# Patient Record
Sex: Female | Born: 2000 | Hispanic: No | Marital: Single | Smoking: Never smoker
Health system: Southern US, Community
[De-identification: ages and names within clinical notes are randomized; demographics above are authoritative.]

## PROBLEM LIST (undated history)

## (undated) DIAGNOSIS — R55 Syncope and collapse: Secondary | ICD-10-CM

## (undated) DIAGNOSIS — L0591 Pilonidal cyst without abscess: Secondary | ICD-10-CM

## (undated) HISTORY — DX: Pilonidal cyst without abscess: L05.91

---
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
## 2022-01-24 ENCOUNTER — Encounter: Payer: Self-pay | Admitting: Physician Assistant

## 2022-01-24 ENCOUNTER — Ambulatory Visit (INDEPENDENT_AMBULATORY_CARE_PROVIDER_SITE_OTHER): Payer: Medicaid Other | Admitting: Physician Assistant

## 2022-01-24 VITALS — BP 108/74 | HR 82 | Temp 97.1°F | Ht 62.6 in | Wt 145.6 lb

## 2022-01-24 DIAGNOSIS — Z0000 Encounter for general adult medical examination without abnormal findings: Secondary | ICD-10-CM | POA: Diagnosis not present

## 2022-01-24 NOTE — Patient Instructions (Signed)
Welcome to [facility] at [facility]! It was a pleasure meeting you today.   PLEASE NOTE:  If you had any LAB tests please let us know if you have not heard back within a few days. You may see your results on MyChart before we have a chance to review them but we will give you a call once they are reviewed by us. If we ordered any REFERRALS today, please let us know if you have not heard from their office within the next two weeks. Let us know through MyChart if you are needing REFILLS, or have your pharmacy send us the request. You can also use MyChart to communicate with me or any office staff.  Please try these tips to maintain a healthy lifestyle:  Eat most of your calories during the day when you are active. Eliminate processed foods including packaged sweets (pies, cakes, cookies), reduce intake of potatoes, white bread, white pasta, and white rice. Look for whole grain options, oat flour or almond flour.  Each meal should contain half fruits/vegetables, one quarter protein, and one quarter carbs (no bigger than a computer mouse).  Cut down on sweet beverages. This includes juice, soda, and sweet tea. Also watch fruit intake, though this is a healthier sweet option, it still contains natural sugar! Limit to 3 servings daily.  Drink at least 1 glass of water with each meal and aim for at least 8 glasses (64 ounces) per day.  Exercise at least 150 minutes every week to the best of your ability.    Take Care,  Daleena Rotter, PA-C    

## 2022-01-24 NOTE — Progress Notes (Signed)
Subjective:    Patient ID: Cindy Cook, female    DOB: 2001/03/11, 21 y.o.   MRN: [id number]  Chief Complaint  Patient presents with   New Patient (Initial Visit)    New pt I office to establish care with PCP; pt due for annual CPE; pt has no health concerns at this time;     HPI Patient is in today for new patient visit. 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 Overall normal health history, healthy family.   Acute concerns: No health concerns today   Health maintenance: Lifestyle/ exercise: Enjoys exercising, tries to stay active with work and walking  Nutrition: Well-balanced, drinks plenty of water  Mental health: Doing well  Caffeine: Not daily  Sleep: No issues  Substance use: None ETOH: None  Sexual activity: Active, monogamous with one female, uses condoms  Immunizations: Declines flu vaccine  Menstrual cycles: Regular   Past Medical History:  Diagnosis Date   Pilonidal cyst    Syncope     History reviewed. No pertinent surgical history.  Family History  Problem Relation Age of Onset   Healthy Mother    Healthy Father    Healthy Sister    Healthy Brother     Social History   Tobacco Use   Smoking status: Never    Passive exposure: Yes   Smokeless tobacco: Never  Vaping Use   Vaping Use: Never used  Substance Use Topics   Alcohol use: Never   Drug use: Never     No Known Allergies  Review of Systems NEGATIVE UNLESS OTHERWISE INDICATED IN HPI      Objective:     BP 108/74 (BP Location: Left Arm)   Pulse 82   Temp (!) 97.1 F (36.2 C) (Temporal)   Ht 5' 2.6" (1.59 m)   Wt 145 lb 9.6 oz (66 kg)   LMP 01/19/2022 (Exact Date)   SpO2 98%   BMI 26.12 kg/m   Wt Readings from Last 3 Encounters:  01/24/22 145 lb 9.6 oz (66 kg) (77 %, Z= 0.75)*  08/24/20 140 lb 12.8 oz (63.9 kg) (77 %, Z= 0.73)*  08/21/19 128 lb (58.1 kg) (63 %, Z= 0.33)*   * Growth percentiles are based on CDC (Girls, 2-20 Years) data.    BP Readings from  Last 3 Encounters:  01/24/22 108/74  08/24/20 (!) 108/60 (44 %, Z = -0.15 /  30 %, Z = -0.52)*  08/21/19 (!) 90/62 (2 %, Z = -2.05 /  39 %, Z = -0.28)*   *BP percentiles are based on the 2017 AAP Clinical Practice Guideline for girls     Physical Exam Vitals and nursing note reviewed.  Constitutional:      Appearance: Normal appearance. She is normal weight. She is not toxic-appearing.  HENT:     Head: Normocephalic and atraumatic.     Right Ear: Tympanic membrane, ear canal and external ear normal.     Left Ear: Tympanic membrane, ear canal and external ear normal.     Nose: Nose normal.     Mouth/Throat:     Mouth: Mucous membranes are moist.  Eyes:     Extraocular Movements: Extraocular movements intact.     Conjunctiva/sclera: Conjunctivae normal.     Pupils: Pupils are equal, round, and reactive to light.  Cardiovascular:     Rate and Rhythm: Normal rate and regular rhythm.     Pulses: Normal pulses.     Heart sounds: Normal heart sounds.  Pulmonary:  Effort: Pulmonary effort is normal.     Breath sounds: Normal breath sounds.  Abdominal:     General: Abdomen is flat. Bowel sounds are normal.     Palpations: Abdomen is soft.  Musculoskeletal:        General: Normal range of motion.     Cervical back: Normal range of motion and neck supple.  Skin:    General: Skin is warm and dry.  Neurological:     General: No focal deficit present.     Mental Status: She is alert and oriented to person, place, and time.  Psychiatric:        Mood and Affect: Mood normal.        Behavior: Behavior normal.        Thought Content: Thought content normal.        Judgment: Judgment normal.        Assessment & Plan:  Encounter for annual physical exam   21 yo female establishing care New pt physical completed No health concerns, defer blood work at this time Discussed safe sex, she is using condoms, does not want birth control; confident in her relationship and declines G/C  screening at this time. Declines flu shot. She will keep up good work with lifestyle - exercise, diet, self-care. Call if concerns.      Return in about 1 year (around 01/25/2023) for Complete physical .    Gerod Caligiuri M Diyari Cherne, PA-C

## 2022-07-04 ENCOUNTER — Encounter: Payer: Self-pay | Admitting: Physician Assistant

## 2022-07-19 ENCOUNTER — Ambulatory Visit: Payer: Medicaid Other | Admitting: Physician Assistant

## 2022-07-19 VITALS — BP 104/66 | HR 73 | Temp 97.7°F | Ht 63.0 in | Wt 154.0 lb

## 2022-07-19 DIAGNOSIS — Z00129 Encounter for routine child health examination without abnormal findings: Secondary | ICD-10-CM

## 2022-07-19 DIAGNOSIS — Z111 Encounter for screening for respiratory tuberculosis: Secondary | ICD-10-CM

## 2022-07-19 NOTE — Patient Instructions (Signed)
Best wishes

## 2022-07-19 NOTE — Progress Notes (Signed)
Subjective:    Patient ID: Cindy Cook, female    DOB: Jul 22, 2000, 22 y.o.   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 No health changes since November.   Very healthy history, no concerns.  Takes no medications or supplements. Enjoys walking outside. Does the best she can with nutrition.   No psychiatric history.  No substance abuse.   Feels well.   Past Medical History:  Diagnosis Date   Pilonidal cyst    Syncope     No past surgical history on file.  Family History  Problem Relation Age of Onset   Healthy Mother    Healthy Father    Healthy Sister    Healthy Brother     Social History   Tobacco Use   Smoking status: Never    Passive exposure: Yes   Smokeless tobacco: Never  Vaping Use   Vaping Use: Never used  Substance Use Topics   Alcohol use: Never   Drug use: Never     No Known Allergies  Review of Systems NEGATIVE UNLESS OTHERWISE INDICATED IN HPI      Objective:     BP 104/66 (BP Location: Left Arm)   Pulse 73   Temp 97.7 F (36.5 C) (Temporal)   Ht 5\' 3"  (1.6 m)   Wt 154 lb (69.9 kg)   LMP 07/16/2022 (Exact Date)   SpO2 100%   BMI 27.28 kg/m   Wt Readings from Last 3 Encounters:  07/19/22 154 lb (69.9 kg) (83 %, Z= 0.97)*  01/24/22 145 lb 9.6 oz (66 kg) (77 %, Z= 0.75)*  08/24/20 140 lb 12.8 oz (63.9 kg) (77 %, Z= 0.73)*   * Growth percentiles are based on CDC (Girls, 2-20 Years) data.    BP Readings from Last 3 Encounters:  07/19/22 104/66  01/24/22 108/74  08/24/20 (!) 108/60 (44 %, Z = -0.15 /  30 %, Z = -0.52)*   *BP percentiles are based on the 2017 AAP Clinical Practice Guideline for girls     Physical Exam Vitals and nursing note reviewed.   Constitutional:      Appearance: Normal appearance.  Eyes:     Extraocular Movements: Extraocular movements intact.     Conjunctiva/sclera: Conjunctivae normal.     Pupils: Pupils are equal, round, and reactive to light.  Cardiovascular:     Rate and Rhythm: Normal rate and regular rhythm.     Pulses: Normal pulses.  Pulmonary:     Effort: Pulmonary effort is normal.     Breath sounds: Normal breath sounds.  Skin:    Findings: No rash.  Neurological:     General: No focal deficit present.     Mental Status: She is alert and oriented to person, place, and time.  Psychiatric:        Mood and Affect: Mood normal.        Behavior: Behavior normal.        Thought Content: Thought content normal.        Judgment: Judgment normal.        Assessment & Plan:  Encounter for pre-school health examination  Screening for tuberculosis -     QuantiFERON-TB Gold Plus   Healthy  female, no health concerns. 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 Forms completed for her today. She will have TB gold completed.  Recheck as scheduled.    Jaret Coppedge M Korbin Mapps, PA-C

## 2022-07-21 LAB — QUANTIFERON-TB GOLD PLUS
Mitogen-NIL: 7.71 IU/mL
NIL: 0.02 IU/mL
QuantiFERON-TB Gold Plus: NEGATIVE
TB1-NIL: 0 IU/mL
TB2-NIL: 0 IU/mL

## 2023-01-29 ENCOUNTER — Encounter: Payer: Medicaid Other | Admitting: Physician Assistant

## 2023-04-04 ENCOUNTER — Encounter: Payer: Self-pay | Admitting: Physician Assistant

## 2023-04-04 ENCOUNTER — Ambulatory Visit (INDEPENDENT_AMBULATORY_CARE_PROVIDER_SITE_OTHER): Payer: Medicaid Other | Admitting: Physician Assistant

## 2023-04-04 VITALS — BP 118/70 | HR 100 | Temp 98.1°F | Ht 63.0 in | Wt 151.0 lb

## 2023-04-04 DIAGNOSIS — Z0000 Encounter for general adult medical examination without abnormal findings: Secondary | ICD-10-CM

## 2023-04-04 DIAGNOSIS — R8290 Unspecified abnormal findings in urine: Secondary | ICD-10-CM

## 2023-04-04 LAB — CBC WITH DIFFERENTIAL/PLATELET
Basophils Absolute: 0 10*3/uL (ref 0.0–0.1)
Basophils Relative: 0.3 % (ref 0.0–3.0)
Eosinophils Absolute: 0.1 10*3/uL (ref 0.0–0.7)
Eosinophils Relative: 1.2 % (ref 0.0–5.0)
HCT: 42.6 % (ref 36.0–46.0)
Hemoglobin: 13.5 g/dL (ref 12.0–15.0)
Lymphocytes Relative: 26.4 % (ref 12.0–46.0)
Lymphs Abs: 1.9 10*3/uL (ref 0.7–4.0)
MCHC: 31.7 g/dL (ref 30.0–36.0)
MCV: 77.1 fL — ABNORMAL LOW (ref 78.0–100.0)
Monocytes Absolute: 0.5 10*3/uL (ref 0.1–1.0)
Monocytes Relative: 6.9 % (ref 3.0–12.0)
Neutro Abs: 4.7 10*3/uL (ref 1.4–7.7)
Neutrophils Relative %: 65.2 % (ref 43.0–77.0)
Platelets: 248 10*3/uL (ref 150.0–400.0)
RBC: 5.53 Mil/uL — ABNORMAL HIGH (ref 3.87–5.11)
RDW: 14.7 % — ABNORMAL HIGH (ref 11.5–14.6)
WBC: 7.2 10*3/uL (ref 4.5–10.5)

## 2023-04-04 LAB — POC URINALSYSI DIPSTICK (AUTOMATED)
Bilirubin, UA: NEGATIVE
Blood, UA: NEGATIVE
Glucose, UA: NEGATIVE
Ketones, UA: NEGATIVE
Leukocytes, UA: NEGATIVE
Nitrite, UA: POSITIVE
Protein, UA: NEGATIVE
Spec Grav, UA: 1.015 (ref 1.010–1.025)
Urobilinogen, UA: 0.2 U/dL
pH, UA: 6 (ref 5.0–8.0)

## 2023-04-04 LAB — LIPID PANEL
Cholesterol: 89 mg/dL (ref 0–200)
HDL: 37.2 mg/dL — ABNORMAL LOW (ref >39.00)
LDL Cholesterol: 37 mg/dL (ref 0–99)
NonHDL: 51.8
Total CHOL/HDL Ratio: 2
Triglycerides: 73 mg/dL (ref 0.0–149.0)
VLDL: 14.6 mg/dL (ref 0.0–40.0)

## 2023-04-04 LAB — COMPREHENSIVE METABOLIC PANEL
ALT: 25 U/L (ref 0–35)
AST: 24 U/L (ref 0–37)
Albumin: 4.9 g/dL (ref 3.5–5.2)
Alkaline Phosphatase: 41 U/L (ref 39–117)
BUN: 9 mg/dL (ref 6–23)
CO2: 23 meq/L (ref 19–32)
Calcium: 9.3 mg/dL (ref 8.4–10.5)
Chloride: 104 meq/L (ref 96–112)
Creatinine, Ser: 0.68 mg/dL (ref 0.40–1.20)
GFR: 125.38 mL/min (ref >60.00)
Glucose, Bld: 80 mg/dL (ref 70–99)
Potassium: 3.6 meq/L (ref 3.5–5.1)
Sodium: 137 meq/L (ref 135–145)
Total Bilirubin: 1 mg/dL (ref 0.2–1.2)
Total Protein: 7.6 g/dL (ref 6.0–8.3)

## 2023-04-04 LAB — TSH: TSH: 0.92 u[IU]/mL (ref 0.35–5.50)

## 2023-04-04 NOTE — Progress Notes (Signed)
Patient ID: Cindy Cook, female    DOB: [birth year], 22 y.o.   MRN: [id number]   Assessment & Plan:  Annual physical exam -     CBC with Differential/Platelet -     Comprehensive metabolic panel -     TSH -     Lipid panel -     POCT Urinalysis Dipstick (Automated)  Abnormal urine odor -     POCT Urinalysis Dipstick (Automated)   Age-appropriate screening and counseling performed today. Will check labs and call with results. Preventive measures discussed and printed in AVS for patient.   Patient Counseling: [x]   Nutrition: Stressed importance of moderation in sodium/caffeine intake, saturated fat and cholesterol, caloric balance, sufficient intake of fresh fruits, vegetables, and fiber.  [x]   Stressed the importance of regular exercise.   [x]   Substance Abuse: Discussed cessation/primary prevention of tobacco, alcohol, or other drug use; driving or other dangerous activities under the influence; availability of treatment for abuse.   []   Injury prevention: Discussed safety belts, safety helmets, smoke detector, smoking near bedding or upholstery.   [x]   Sexuality: Discussed sexually transmitted diseases, partner selection, use of condoms, avoidance of unintended pregnancy  and contraceptive alternatives.   [x]   Dental health: Discussed importance of regular tooth brushing, flossing, and dental visits.  [x]   Health maintenance and immunizations reviewed. Please refer to Health maintenance section.        Strong Urine Odor Noted for the past 6 months. No dysuria, urgency, frequency, or hematuria. No significant changes in diet or fluid intake. No recent sexual activity. Possible differential diagnoses include dehydration, dietary changes, urinary tract infection, or bacterial vaginosis. -Collect urine sample for urinalysis. -Consider vaginal swab if no clear cause identified from urinalysis.  General Health Maintenance -Plan for Pap smear at age 73.           Return in about 1 year (around 04/03/2024).    Subjective:    Chief Complaint  Patient presents with   Annual Exam    Pt in office for annual CPE and fasting labs; pt wanting to know if she can update TDAP today but not due till August; pt will need for school;     HPI Discussed the use of AI scribe software for clinical note transcription with the patient, who gave verbal consent to proceed.  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 She reports a strong urine odor for the past six months, but denies dysuria, urinary frequency, or urgency. She has not noticed any changes in her diet or fluid intake, and denies significant caffeine use. She also reports regular menstrual cycles and denies recent sexual activity. She has noticed vaginal discharge, but describes it as her 'normal.' She has been exercising regularly, including walking, running, and weight lifting. She denies any skin changes or rashes. She recently returned from a trip to [place] to visit family.       Past Medical History:  Diagnosis Date   Pilonidal cyst    Syncope     History reviewed. No pertinent surgical history.  Family History  Problem Relation Age of Onset   Healthy Mother    Varicose Veins Mother    Healthy Father    Healthy Sister    Healthy Brother    Early death Maternal Uncle     Social History   Tobacco Use   Smoking status: Never    Passive exposure:  Yes   Smokeless tobacco: Never  Vaping Use   Vaping status: Never Used  Substance Use Topics   Alcohol use: Never   Drug use: Never     No Known Allergies  Review of Systems NEGATIVE UNLESS OTHERWISE INDICATED IN HPI      Objective:     BP 118/70 (BP Location: Left Arm, Patient Position: Sitting, Cuff Size: Normal)   Pulse 100   Temp 98.1 F (36.7 C) (Temporal)   Ht 5\' 3"  (1.6 m)   Wt 151 lb (68.5 kg)   LMP 03/09/2023 (Exact Date)   SpO2 95%   BMI 26.75  kg/m   Wt Readings from Last 3 Encounters:  04/04/23 151 lb (68.5 kg)  07/19/22 154 lb (69.9 kg) (83%, Z= 0.97)*  01/24/22 145 lb 9.6 oz (66 kg) (77%, Z= 0.75)*   * Growth percentiles are based on CDC (Girls, 2-20 Years) data.    BP Readings from Last 3 Encounters:  04/04/23 118/70  07/19/22 104/66  01/24/22 108/74     Physical Exam Vitals and nursing note reviewed.  Constitutional:      Appearance: Normal appearance. She is normal weight. She is not toxic-appearing.  HENT:     Head: Normocephalic and atraumatic.     Right Ear: Tympanic membrane, ear canal and external ear normal.     Left Ear: Tympanic membrane, ear canal and external ear normal.     Nose: Nose normal.     Mouth/Throat:     Mouth: Mucous membranes are moist.  Eyes:     Extraocular Movements: Extraocular movements intact.     Conjunctiva/sclera: Conjunctivae normal.     Pupils: Pupils are equal, round, and reactive to light.  Cardiovascular:     Rate and Rhythm: Normal rate and regular rhythm.     Pulses: Normal pulses.     Heart sounds: Normal heart sounds.  Pulmonary:     Effort: Pulmonary effort is normal.     Breath sounds: Normal breath sounds.  Abdominal:     General: Abdomen is flat. Bowel sounds are normal.     Palpations: Abdomen is soft.  Musculoskeletal:        General: Normal range of motion.     Cervical back: Normal range of motion and neck supple.  Skin:    General: Skin is warm and dry.  Neurological:     General: No focal deficit present.     Mental Status: She is alert and oriented to person, place, and time.  Psychiatric:        Mood and Affect: Mood normal.        Behavior: Behavior normal.        Thought Content: Thought content normal.        Judgment: Judgment normal.         Hyde Sires M Othel Hoogendoorn, PA-C

## 2023-04-06 LAB — URINE CULTURE
MICRO NUMBER:: 15964607
SPECIMEN QUALITY:: ADEQUATE

## 2023-04-07 ENCOUNTER — Encounter: Payer: Self-pay | Admitting: Physician Assistant

## 2023-04-07 MED ORDER — NITROFURANTOIN MONOHYD MACRO 100 MG PO CAPS: 100.0000 mg | ORAL_CAPSULE | Freq: Two times a day (BID) | ORAL | 0 refills | Status: AC

## 2023-10-07 ENCOUNTER — Encounter: Payer: Self-pay | Admitting: Physician Assistant

## 2023-10-08 ENCOUNTER — Other Ambulatory Visit: Payer: Self-pay

## 2023-10-08 DIAGNOSIS — Z111 Encounter for screening for respiratory tuberculosis: Secondary | ICD-10-CM

## 2023-10-08 NOTE — Telephone Encounter (Signed)
 Please call patient and schedule for lab appt and nurse visit, preferably same day to avoid multiple visits to office. Future orders placed

## 2023-10-08 NOTE — Telephone Encounter (Signed)
 Ok to schedule patient for nurse visit and lab only visit?

## 2023-10-15 ENCOUNTER — Ambulatory Visit (INDEPENDENT_AMBULATORY_CARE_PROVIDER_SITE_OTHER): Admitting: *Deleted

## 2023-10-15 ENCOUNTER — Other Ambulatory Visit

## 2023-10-15 DIAGNOSIS — Z23 Encounter for immunization: Secondary | ICD-10-CM

## 2023-10-15 NOTE — Progress Notes (Signed)
 Patient present for TDAP Given IM Rt deltoid  Patient tolerated well

## 2023-10-17 LAB — QUANTIFERON-TB GOLD PLUS
Mitogen-NIL: 7.24 [IU]/mL
NIL: 0.03 [IU]/mL
QuantiFERON-TB Gold Plus: NEGATIVE
TB1-NIL: <0 [IU]/mL
TB2-NIL: <0 [IU]/mL

## 2023-10-18 ENCOUNTER — Ambulatory Visit: Payer: Self-pay | Admitting: Physician Assistant

## 2023-12-31 ENCOUNTER — Ambulatory Visit (HOSPITAL_COMMUNITY)
Admission: RE | Admit: 2023-12-31 | Discharge: 2023-12-31 | Disposition: A | Discharge status: Home / Self Care | Source: Ambulatory Visit | Attending: Family Medicine | Admitting: Family Medicine

## 2023-12-31 ENCOUNTER — Encounter (HOSPITAL_COMMUNITY): Payer: Self-pay

## 2023-12-31 VITALS — BP 101/69 | HR 82 | Temp 99.1°F | Resp 16

## 2023-12-31 DIAGNOSIS — L0291 Cutaneous abscess, unspecified: Secondary | ICD-10-CM

## 2023-12-31 MED ORDER — SULFAMETHOXAZOLE-TRIMETHOPRIM 800-160 MG PO TABS: 1.0000 | ORAL_TABLET | Freq: Two times a day (BID) | ORAL | 0 refills | Status: DC

## 2023-12-31 MED ORDER — CEFDINIR 300 MG PO CAPS
300.0000 mg | ORAL_CAPSULE | Freq: Two times a day (BID) | ORAL | 0 refills | Status: DC
Start: 2023-12-31 — End: 2024-01-06

## 2023-12-31 NOTE — ED Triage Notes (Signed)
 Patient here today with c/o a pilonidal abscess since Saturday. Area is tender and sore. Patient has taken Ibuprofen with no relief and also put an ice pack on it.

## 2023-12-31 NOTE — ED Provider Notes (Signed)
 [facility]    CSN: [id number] Arrival date & time: 12/31/23  1250      History   Chief Complaint Chief Complaint  Patient presents with   Abscess    History of pilonidal cyst , experiencing similar symptoms of tenderness to touch and from movement /prolonged sitting near tailbone - Entered by patient    HPI Cindy Cook is a 23 y.o. female.    Abscess  Patient is here for a pilonidal cyst.  It has been there x 4 days.  Has not opened and drained.  She cannot see any redness/raised skin, but she can feel it when she sits.  Some night sweats.  No fevers/chills otherwise.  Mild nausea.  She had a pilonidal cyst drained at the age of 69.  She had f/u with pediatric surgery at that time.  She was initially placed on clindamycin , but was having worsening symptoms, so switched to bactrim /omnicef  with help.  She does state this soreness is in a little bit different spot than previous.  More to the upper left buttocks rather than in the center.        Past Medical History:  Diagnosis Date   Pilonidal cyst    Syncope     Patient Active Problem List   Diagnosis Date Noted   Pilonidal cyst 08/27/2018   Overweight, pediatric, BMI 85.0-94.9 percentile for age 26/02/2016   Hypopigmentation 06/27/2015    History reviewed. No pertinent surgical history.  OB History   No obstetric history on file.      Home Medications    Prior to Admission medications   Not on File    Family History Family History  Problem Relation Age of Onset   Healthy Mother    Varicose Veins Mother    Healthy Father    Healthy Sister    Healthy Brother    Early death Maternal Uncle     Social History Social History   Tobacco Use   Smoking status: Never    Passive exposure: Yes   Smokeless tobacco: Never  Vaping Use   Vaping status: Never Used  Substance Use Topics   Alcohol use: Yes    Comment: socially   Drug use: Never     Allergies    Patient has no known allergies.   Review of Systems Review of Systems  Constitutional: Negative.   HENT: Negative.    Respiratory: Negative.    Cardiovascular: Negative.   Gastrointestinal: Negative.   Genitourinary: Negative.   Musculoskeletal: Negative.      Physical Exam Triage Vital Signs ED Triage Vitals [12/31/23 1354]  Encounter Vitals Group     BP 101/69     Girls Systolic BP Percentile      Girls Diastolic BP Percentile      Boys Systolic BP Percentile      Boys Diastolic BP Percentile      Pulse Rate 82     Resp 16     Temp 99.1 F (37.3 C)     Temp Source Oral     SpO2 99 %     Weight      Height      Head Circumference      Peak Flow      Pain Score 4     Pain Loc      Pain Education      Exclude from Growth Chart    No data found.  Updated Vital Signs BP 101/69 (BP Location:  Right Arm)   Pulse 82   Temp 99.1 F (37.3 C) (Oral)   Resp 16   LMP 12/02/2023 (Exact Date)   SpO2 99%   Visual Acuity Right Eye Distance:   Left Eye Distance:   Bilateral Distance:    Right Eye Near:   Left Eye Near:    Bilateral Near:     Physical Exam Constitutional:      Appearance: Normal appearance. She is normal weight.  Skin:    Comments: No redness or area of redness noted to the gluteal cleft or buttocks;  the area of tenderness she describes is at the left superior medial buttock;  there is no area of fullness noted under the skin;  tenderness with deep palpation  Neurological:     General: No focal deficit present.     Mental Status: She is alert.  Psychiatric:        Mood and Affect: Mood normal.      UC Treatments / Results  Labs (all labs ordered are listed, but only abnormal results are displayed) Labs Reviewed - No data to display  EKG   Radiology No results found.  Procedures Procedures (including critical care time)  Medications Ordered in UC Medications - No data to display  Initial Impression / Assessment and Plan / UC  Course  I have reviewed the triage vital signs and the nursing notes.  Pertinent labs & imaging results that were available during my care of the patient were reviewed by me and considered in my medical decision making (see chart for details).   Final Clinical Impressions(s) / UC Diagnoses   Final diagnoses:  Abscess     Discharge Instructions      You were seen today for a cyst to the buttocks.  As discussed, there is nothing to open and drain today.  I have sent out two antibiotic to take for possible deep abscess.  If you have worsening symptoms despite the medications, then please go to the Emergency Room for further evaluation and treatment.     ED Prescriptions     Medication Sig Dispense Auth. Provider   sulfamethoxazole -trimethoprim  (BACTRIM  DS) 800-160 MG tablet Take 1 tablet by mouth 2 (two) times daily for 7 days. 14 tablet Sha Amer, MD   cefdinir  (OMNICEF ) 300 MG capsule Take 1 capsule (300 mg total) by mouth 2 (two) times daily for 7 days. 14 capsule Darral Longs, MD      PDMP not reviewed this encounter.   Darral Longs, MD 12/31/23 1410

## 2023-12-31 NOTE — Discharge Instructions (Signed)
 You were seen today for a cyst to the buttocks.  As discussed, there is nothing to open and drain today.  I have sent out two antibiotic to take for possible deep abscess.  If you have worsening symptoms despite the medications, then please go to the Emergency Room for further evaluation and treatment.

## 2024-01-06 ENCOUNTER — Other Ambulatory Visit: Payer: Self-pay

## 2024-01-06 ENCOUNTER — Encounter (HOSPITAL_COMMUNITY): Payer: Self-pay | Admitting: *Deleted

## 2024-01-06 ENCOUNTER — Ambulatory Visit (HOSPITAL_COMMUNITY): Admission: EM | Admit: 2024-01-06 | Discharge: 2024-01-06 | Disposition: A | Discharge status: Home / Self Care

## 2024-01-06 ENCOUNTER — Emergency Department (HOSPITAL_COMMUNITY)
Admission: EM | Admit: 2024-01-06 | Discharge: 2024-01-06 | Disposition: A | Discharge status: Home / Self Care | Attending: Emergency Medicine | Admitting: Emergency Medicine

## 2024-01-06 ENCOUNTER — Encounter (HOSPITAL_COMMUNITY): Payer: Self-pay

## 2024-01-06 DIAGNOSIS — L0501 Pilonidal cyst with abscess: Secondary | ICD-10-CM

## 2024-01-06 DIAGNOSIS — R000 Tachycardia, unspecified: Secondary | ICD-10-CM | POA: Insufficient documentation

## 2024-01-06 LAB — COMPREHENSIVE METABOLIC PANEL WITH GFR
ALT: 21 U/L (ref 0–44)
AST: 23 U/L (ref 15–41)
Albumin: 3.8 g/dL (ref 3.5–5.0)
Alkaline Phosphatase: 40 U/L (ref 38–126)
Anion gap: 11 (ref 5–15)
BUN: 9 mg/dL (ref 6–20)
CO2: 21 mmol/L — ABNORMAL LOW (ref 22–32)
Calcium: 8.7 mg/dL — ABNORMAL LOW (ref 8.9–10.3)
Chloride: 103 mmol/L (ref 98–111)
Creatinine, Ser: 0.87 mg/dL (ref 0.44–1.00)
GFR, Estimated: >60 mL/min (ref >60)
Glucose, Bld: 75 mg/dL (ref 70–99)
Potassium: 3.5 mmol/L (ref 3.5–5.1)
Sodium: 135 mmol/L (ref 135–145)
Total Bilirubin: 0.8 mg/dL (ref 0.0–1.2)
Total Protein: 7.4 g/dL (ref 6.5–8.1)

## 2024-01-06 LAB — CBC WITH DIFFERENTIAL/PLATELET
Abs Immature Granulocytes: 0.01 K/uL (ref 0.00–0.07)
Basophils Absolute: 0 K/uL (ref 0.0–0.1)
Basophils Relative: 0 %
Eosinophils Absolute: 0 K/uL (ref 0.0–0.5)
Eosinophils Relative: 1 %
HCT: 39.4 % (ref 36.0–46.0)
Hemoglobin: 12.4 g/dL (ref 12.0–15.0)
Immature Granulocytes: 0 %
Lymphocytes Relative: 6 %
Lymphs Abs: 0.3 K/uL — ABNORMAL LOW (ref 0.7–4.0)
MCH: 23.8 pg — ABNORMAL LOW (ref 26.0–34.0)
MCHC: 31.5 g/dL (ref 30.0–36.0)
MCV: 75.8 fL — ABNORMAL LOW (ref 80.0–100.0)
Monocytes Absolute: 0.4 K/uL (ref 0.1–1.0)
Monocytes Relative: 7 %
Neutro Abs: 4.8 K/uL (ref 1.7–7.7)
Neutrophils Relative %: 86 %
Platelets: 216 K/uL (ref 150–400)
RBC: 5.2 MIL/uL — ABNORMAL HIGH (ref 3.87–5.11)
RDW: 14.3 % (ref 11.5–15.5)
WBC: 5.5 K/uL (ref 4.0–10.5)
nRBC: 0 % (ref 0.0–0.2)

## 2024-01-06 LAB — I-STAT CG4 LACTIC ACID, ED: Lactic Acid, Venous: 1 mmol/L (ref 0.5–1.9)

## 2024-01-06 LAB — HCG, SERUM, QUALITATIVE: Preg, Serum: NEGATIVE

## 2024-01-06 MED ORDER — DOXYCYCLINE HYCLATE 100 MG PO CAPS
100.0000 mg | ORAL_CAPSULE | Freq: Two times a day (BID) | ORAL | 0 refills | Status: DC
Start: 2024-01-06 — End: 2024-01-13

## 2024-01-06 MED ORDER — SODIUM CHLORIDE 0.9 % IV BOLUS (SEPSIS)
1000.0000 mL | Freq: Once | INTRAVENOUS | Status: AC
  Administered 2024-01-06: 1000 mL via INTRAVENOUS

## 2024-01-06 MED ORDER — OXYCODONE HCL 5 MG PO TABS
5.0000 mg | ORAL_TABLET | ORAL | Status: AC
  Administered 2024-01-06: 5 mg via ORAL
  Filled 2024-01-06: qty 1

## 2024-01-06 MED ORDER — KETOROLAC TROMETHAMINE 15 MG/ML IJ SOLN
15.0000 mg | Freq: Once | INTRAMUSCULAR | Status: AC
  Administered 2024-01-06: 15 mg via INTRAVENOUS
  Filled 2024-01-06: qty 1

## 2024-01-06 MED ORDER — LIDOCAINE-EPINEPHRINE (PF) 2 %-1:200000 IJ SOLN
10.0000 mL | Freq: Once | INTRAMUSCULAR | Status: AC
Start: 2024-01-06 — End: 2024-01-06
  Administered 2024-01-06: 10 mL via INTRADERMAL
  Filled 2024-01-06: qty 20

## 2024-01-06 MED ORDER — OXYCODONE HCL 5 MG PO TABS: 5.0000 mg | ORAL_TABLET | ORAL | 0 refills | Status: AC | PRN

## 2024-01-06 MED ORDER — ACETAMINOPHEN 500 MG PO TABS
1000.0000 mg | ORAL_TABLET | ORAL | Status: AC
  Administered 2024-01-06: 1000 mg via ORAL
  Filled 2024-01-06: qty 2

## 2024-01-06 MED ORDER — SODIUM CHLORIDE 0.9 % IV SOLN
2.0000 g | Freq: Once | INTRAVENOUS | Status: AC
  Administered 2024-01-06: 2 g via INTRAVENOUS
  Filled 2024-01-06: qty 20

## 2024-01-06 MED ORDER — VANCOMYCIN HCL IN DEXTROSE 1-5 GM/200ML-% IV SOLN: 1000.0000 mg | Freq: Once | INTRAVENOUS | Status: DC

## 2024-01-06 MED ORDER — CEFDINIR 300 MG PO CAPS: 300.0000 mg | ORAL_CAPSULE | Freq: Two times a day (BID) | ORAL | 0 refills | Status: DC

## 2024-01-06 MED ORDER — VANCOMYCIN HCL 1250 MG/250ML IV SOLN
1250.0000 mg | Freq: Once | INTRAVENOUS | Status: AC
  Administered 2024-01-06: 1250 mg via INTRAVENOUS
  Filled 2024-01-06: qty 250

## 2024-01-06 NOTE — ED Notes (Signed)
 Patient discharged to home with self. Patient given written and oral discharge instructions. Patient verbalized understanding. Patient ambulatory to to exit with steady gait. Patient breathing without difficulty. Patient denies pain. Patient denies questions, concerns or needs at this time.

## 2024-01-06 NOTE — ED Provider Notes (Signed)
 Patient presents to clinic over concern of continued abscess to the pilonidal area.  Has been compliant with oral antibiotics, cefdinir  and Bactrim .  Her last day of these is tomorrow, taken for a total of 7 days.  Has been taking ibuprofen throughout the last 7 days but noticed over the past 3 days she has been having hot and cold chills and night sweats.  Has been taking her temperature and having fevers at home.  Temperature this morning was 101 at home prior to arrival.  Discussed with patient that with failure of outpatient antibiotics, fever, tachycardia and known source of infection that I am concerned about sepsis.  Patient verbalized understanding and will head to nearest emergency department for further advanced evaluation via POV.  Stable for transfer.   Cindy Cook, Cindy Cook  N, FNP 01/06/24 [phone]

## 2024-01-06 NOTE — ED Provider Notes (Signed)
 [place] EMERGENCY DEPARTMENT AT [facility] Provider Note   CSN: [id number] Arrival date & time: 01/06/24  9081     Patient presents with: Cyst   Cindy Cook is a 23 y.o. female.  {Add pertinent medical, surgical, social history, OB history to HPI:146} 23 year old female history of pilonidal abscess who presents to the emergency department for fever and sacral pain.  Patient reports over the past few weeks she had a pilonidal abscess that occurred.  Went to urgent care last week and was started on antibiotics.  First dose was on Wednesday.  Has been taking cefdinir  and Bactrim .  Says that despite this she is started developing fevers with a temperature of 101.44F this morning.  Also has been having some chills.  Says that the area appears more swollen and painful than before.       Prior to Admission medications   Medication Sig Start Date End Date Taking? Authorizing Provider  cefdinir  (OMNICEF ) 300 MG capsule Take 1 capsule (300 mg total) by mouth 2 (two) times daily for 7 days. 12/31/23 01/07/24  Piontek, Rocky, MD  sulfamethoxazole -trimethoprim  (BACTRIM  DS) 800-160 MG tablet Take 1 tablet by mouth 2 (two) times daily for 7 days. 12/31/23 01/07/24  Darral Rocky, MD    Allergies: Patient has no known allergies.    Review of Systems  Updated Vital Signs BP 125/75 (BP Location: Right Arm)   Pulse (!) 130   Temp 100.2 F (37.9 C)   Resp 17   Ht 5' 2 (1.575 m)   Wt 68 kg   LMP 01/05/2024 (Exact Date)   SpO2 100%   BMI 27.44 kg/m   Physical Exam Constitutional:      Appearance: Normal appearance.  Cardiovascular:     Rate and Rhythm: Regular rhythm. Tachycardia present.  Skin:    Comments: Chaperoned by Burnard Feeling RN.  3 cm area of induration and fluctuance.  Small amount of overlying erythema noted.  Neurological:     Mental Status: She is alert.     (all labs ordered are listed, but only abnormal results are displayed) Labs  Reviewed  CULTURE, BLOOD (ROUTINE X 2)  CULTURE, BLOOD (ROUTINE X 2)  COMPREHENSIVE METABOLIC PANEL WITH GFR  CBC WITH DIFFERENTIAL/PLATELET  HCG, SERUM, QUALITATIVE  I-STAT CG4 LACTIC ACID, ED    EKG: None  Radiology: No results found.  {Document cardiac monitor, telemetry assessment procedure when appropriate:32947} .Incision and Drainage  Date/Time: 01/06/2024 3:01 PM  Performed by: Yolande Lamar BROCKS, MD Authorized by: Yolande Lamar BROCKS, MD   Consent:    Consent obtained:  Verbal   Consent given by:  Patient   Risks discussed:  Bleeding, incomplete drainage, pain, damage to other organs and infection   Alternatives discussed:  No treatment and alternative treatment Universal protocol:    Patient identity confirmed:  Verbally with patient Location:    Type:  Pilonidal cyst   Size:  3 cm   Location:  Trunk   Trunk location:  Back Pre-procedure details:    Skin preparation:  Chlorhexidine Sedation:    Sedation type:  None Anesthesia:    Anesthesia method:  Local infiltration   Local anesthetic:  Lidocaine  2% WITH epi Procedure type:    Complexity:  Simple Procedure details:    Ultrasound guidance: yes     Incision types:  Single straight   Wound management:  Probed and deloculated   Drainage:  Bloody and purulent   Drainage amount:  Copious  Wound treatment:  Wound left open and drain placed   Packing materials:  1/4 in gauze Post-procedure details:    Procedure completion:  Tolerated well, no immediate complications    Medications Ordered in the ED  lidocaine -EPINEPHrine  (XYLOCAINE  W/EPI) 2 %-1:200000 (PF) injection 10 mL (has no administration in time range)  sodium chloride  0.9 % bolus 1,000 mL (has no administration in time range)  cefTRIAXone (ROCEPHIN) 2 g in sodium chloride  0.9 % 100 mL IVPB (has no administration in time range)  vancomycin (VANCOCIN) IVPB 1000 mg/200 mL premix (has no administration in time range)  acetaminophen  (TYLENOL ) tablet  1,000 mg (has no administration in time range)      {Click here for ABCD2, HEART and other calculators REFRESH Note before signing:1}                              Medical Decision Making Amount and/or Complexity of Data Reviewed Labs: ordered.  Risk OTC drugs. Prescription drug management.   ***  {Document critical care time when appropriate  Document review of labs and clinical decision tools ie CHADS2VASC2, etc  Document your independent review of radiology images and any outside records  Document your discussion with family members, caretakers and with consultants  Document social determinants of health affecting pt's care  Document your decision making why or why not admission, treatments were needed:32947:::1}   Final diagnoses:  None    ED Discharge Orders     None

## 2024-01-06 NOTE — ED Triage Notes (Signed)
 Patient reports that she has an abscess near the tail bone. Patient reports a history of a pilonidal cyst. Patient was seen last week and was prescribed antibiotics. Patient also states she has been having a fever x 3 days and continues to have discomfort.

## 2024-01-06 NOTE — Discharge Instructions (Signed)
 I am worried that your infection may be systemic with you having fevers, tachycardia and worsening symptoms of your pilonidal abscess despite dual oral antibiotic therapy.  Please head to the nearest emergency department for further advanced evaluation.

## 2024-01-06 NOTE — Discharge Instructions (Signed)
 You were seen for pilonidal abscess in the emergency department.  It was drained and you have packing in.  At home, please continue the Omnicef  for 7 more days.  Take the doxycycline we have prescribed you and stop the Bactrim .  Use Tylenol  and ibuprofen for pain.  Use oxycodone for any breakthrough pain that you have but do not take it before driving because it can make you drowsy.    Check your MyChart online for the results of any tests that had not resulted by the time you left the emergency department.   Follow-up with your primary doctor or the emergency department in 2-3 days for a wound check and to have the packing removed.    Return immediately to the emergency department if you experience any concerning symptoms.    Thank you for visiting our Emergency Department. It was a pleasure taking care of you today.

## 2024-01-06 NOTE — ED Triage Notes (Signed)
 C/o abscess near her tail bone was seen at [facility]  was started on antibiotics was feeling better several days ago it started hurting worse.

## 2024-01-06 NOTE — ED Notes (Signed)
 Patient is being discharged from the Urgent Care and sent to the Emergency Department via POV . Per [place]  Dreama, NP, patient is in need of higher level of care due to fever, tachycardia, and abscess. Patient is aware and verbalizes understanding of plan of care.  Vitals:   01/06/24 0856  BP: 123/79  Pulse: (!) 124  Resp: 14  Temp: 100 F (37.8 C)  SpO2: 99%

## 2024-01-08 ENCOUNTER — Encounter: Payer: Self-pay | Admitting: Family

## 2024-01-08 ENCOUNTER — Ambulatory Visit: Admitting: Family

## 2024-01-08 VITALS — BP 96/74 | HR 88 | Temp 98.2°F | Ht 62.0 in | Wt 140.0 lb

## 2024-01-08 DIAGNOSIS — L0591 Pilonidal cyst without abscess: Secondary | ICD-10-CM | POA: Diagnosis not present

## 2024-01-08 NOTE — Progress Notes (Signed)
 Patient ID: Cindy Cook, female    DOB: 21-Apr-2000, 21 y.o.   MRN: [id number]  Chief Complaint  Patient presents with   Pilonidal cyst with abscess    Pt was seen in ED on 10/20. Pt c/o fever during ED visit. Wants to follow up from wound care  Discussed the use of AI scribe software for clinical note transcription with the patient, who gave verbal consent to proceed.  History of Present Illness Cindy Cook is a 23 year old female with a history of pilonidal cyst who presents for follow-up after drainage.  She had a pilonidal cyst drained a couple of days ago in the ED, given IV abt, sepsis ruled out (per review of ED notes) and is currently on doxycycline and cefdinir . The cyst was packed with packing strip and covered with gauze, which has not been changed since the procedure. Currently, she experiences minimal pain and only minimal drainage, with the gauze being slightly moist. She was prescribed pain medication but has not needed to use it.  This is not her first occurrence; she had a similar issue about six years ago at the age of 6, which required drainage three times due to persistent infection. This time, she feels the situation is much improved as she is able to walk and sit without pain, unlike the previous episode.  Assessment & Plan Pilonidal cyst with abscess, post-incision and drainage Status post incision and drainage two days ago. Reports improvement with less pain and better mobility. Approx. 0.4cm wound opening, moderate amount of spontaneous purulent drainage observed after removing packing strip, able to express a copious amount of pus, patient tolerated, though quite painful for her. Small amount of induration noted periwound. Area cleaned with betadine, repacked with packing strip, approx. 3.5cm deep, covered with several 2x2 inch gauze, secured with Tegaderm. - Advised to keep the area dry, ok to change outer gauze if  moist, leaving strip gauze in wound until the follow-up visit. - Advised to return in two days for recheck. - Instruct to take oxycodone after eating when she goes home and prior to next OV if someone can drive her.  Subjective:    Outpatient Medications Prior to Visit  Medication Sig Dispense Refill   cefdinir  (OMNICEF ) 300 MG capsule Take 1 capsule (300 mg total) by mouth 2 (two) times daily for 7 days. 14 capsule 0   doxycycline (VIBRAMYCIN) 100 MG capsule Take 1 capsule (100 mg total) by mouth 2 (two) times daily for 7 days. 14 capsule 0   oxyCODONE (ROXICODONE) 5 MG immediate release tablet Take 1 tablet (5 mg total) by mouth every 4 (four) hours as needed for severe pain (pain score 7-10). 12 tablet 0   No facility-administered medications prior to visit.   Past Medical History:  Diagnosis Date   Pilonidal cyst    Syncope    History reviewed. No pertinent surgical history. No Known Allergies    Objective:    Physical Exam Vitals and nursing note reviewed.  Constitutional:      Appearance: Normal appearance.  Cardiovascular:     Rate and Rhythm: Normal rate and regular rhythm.  Pulmonary:     Effort: Pulmonary effort is normal.     Breath sounds: Normal breath sounds.  Musculoskeletal:        General: Normal range of motion.  Skin:    General: Skin is warm and dry.     Findings: Abscess (Approx. 0.4cm wound opening at buttock  cleft, mod amt spontaneous purulent drg observed after removing packing strip, able to further express a copious amount of pus. Small amount of induration noted periwound. Depth approx 3.5cm) present.  Neurological:     Mental Status: She is alert.  Psychiatric:        Mood and Affect: Mood normal.        Behavior: Behavior normal.    BP 96/74 (BP Location: Left Arm, Patient Position: Sitting, Cuff Size: Normal)   Pulse 88   Temp 98.2 F (36.8 C) (Temporal)   Ht 5' 2 (1.575 m)   Wt 140 lb (63.5 kg)   LMP 01/05/2024 (Exact Date)   SpO2  99%   BMI 25.61 kg/m  Wt Readings from Last 3 Encounters:  01/08/24 140 lb (63.5 kg)  01/06/24 150 lb (68 kg)  04/04/23 151 lb (68.5 kg)   *I personally spent a total of 34 minutes in the care of the patient today including preparing to see the patient, getting/reviewing separately obtained history, performing a medically appropriate exam/evaluation including wound care with large amt of drainage, counseling and educating, placing orders, and documenting clinical information in the EHR.     Lucius Krabbe, NP

## 2024-01-10 ENCOUNTER — Ambulatory Visit (INDEPENDENT_AMBULATORY_CARE_PROVIDER_SITE_OTHER): Admitting: Family

## 2024-01-10 VITALS — BP 94/62 | HR 61 | Temp 97.2°F | Ht 62.0 in | Wt 141.4 lb

## 2024-01-10 DIAGNOSIS — L0591 Pilonidal cyst without abscess: Secondary | ICD-10-CM | POA: Diagnosis not present

## 2024-01-10 NOTE — Progress Notes (Signed)
 Patient ID: Cindy Cook, female    DOB: 2001-03-09, 23 y.o.   MRN: [id number]  Chief Complaint  Patient presents with   Pilonidal cyst    Pt c/o discomfort but denies any pain.   Discussed the use of AI scribe software for clinical note transcription with the patient, who gave verbal consent to proceed.  History of Present Illness Cindy Cook is a 23 year old female with a history of pilonidal cyst who presents for follow-up for wound care. She is accompanied by her mother.  She had a pilonidal cyst drained 4 days ago in the ED, given IV abt, sepsis ruled out (per review of ED notes) and is currently on doxycycline and cefdinir . The cyst was packed with packing strip and covered with gauze, which was changed in this office 2 days ago. Currently, she experiences minimal pain, and denies drainage, has not needed to change the bandage. She was prescribed Oxycodone which she had not taken, but advised to take after dressing change 2 days ago due to extreme pain and advised to take prior to visit today which she has done.  This is not her first occurrence; she had a similar issue about six years ago at the age of 79, which required drainage three times due to persistent infection.   Assessment & Plan Pilonidal cyst with abscess Status post incision and drainage 4 days ago and wound packing change 2 days ago. Reports improvement with less pain and better mobility. Approx. 0.4cm wound opening, minimal amount of spontaneous thinner purulent drainage expressed after removing packing strip, patient tolerated better than 2d ago, but still painful. Area of induration smaller periwound. Area cleaned with betadine, repacked with 1/2 packing strip, approx. 3.0cm deep, covered with several 2x2 inch gauze, secured with Tegaderm. - Continue antibiotics as directed. - Increase protein and Vitamin C intake. - Advised to keep the area dry, ok to change outer gauze  if moist, leaving strip gauze in wound until the follow-up visit. - Instruct to take oxycodone after eating when home if needed and prior to next OV. - Advised to return in 3 days for recheck.  Subjective:    Outpatient Medications Prior to Visit  Medication Sig Dispense Refill   cefdinir  (OMNICEF ) 300 MG capsule Take 1 capsule (300 mg total) by mouth 2 (two) times daily for 7 days. 14 capsule 0   doxycycline (VIBRAMYCIN) 100 MG capsule Take 1 capsule (100 mg total) by mouth 2 (two) times daily for 7 days. 14 capsule 0   oxyCODONE (ROXICODONE) 5 MG immediate release tablet Take 1 tablet (5 mg total) by mouth every 4 (four) hours as needed for severe pain (pain score 7-10). 12 tablet 0   No facility-administered medications prior to visit.   Past Medical History:  Diagnosis Date   Pilonidal cyst    Syncope    No past surgical history on file. No Known Allergies    Objective:    Physical Exam Vitals and nursing note reviewed.  Constitutional:      Appearance: Normal appearance.  Cardiovascular:     Rate and Rhythm: Normal rate and regular rhythm.  Pulmonary:     Effort: Pulmonary effort is normal.     Breath sounds: Normal breath sounds.  Musculoskeletal:        General: Normal range of motion.  Skin:    General: Skin is warm and dry.     Findings: Abscess (Approx. 0.4cm wound opening at buttock cleft,  able to express small of thin purulant drg. Small amount of induration noted periwound. Depth approx 3.0cm) present.  Neurological:     Mental Status: She is alert.  Psychiatric:        Mood and Affect: Mood normal.        Behavior: Behavior normal.    BP 94/62 (BP Location: Left Arm, Patient Position: Sitting, Cuff Size: Normal)   Pulse 61   Temp (!) 97.2 F (36.2 C) (Temporal)   Ht 5' 2 (1.575 m)   Wt 141 lb 6 oz (64.1 kg)   LMP 01/05/2024 (Exact Date)   SpO2 98%   BMI 25.86 kg/m  Wt Readings from Last 3 Encounters:  01/10/24 141 lb 6 oz (64.1 kg)  01/08/24  140 lb (63.5 kg)  01/06/24 150 lb (68 kg)      Lucius Krabbe, NP

## 2024-01-10 NOTE — Addendum Note (Signed)
 Addended by: Jaidence Geisler on: 01/10/2024 10:53 PM   Modules accepted: Orders

## 2024-01-11 LAB — CULTURE, BLOOD (ROUTINE X 2)
Culture: NO GROWTH
Culture: NO GROWTH
Special Requests: ADEQUATE

## 2024-01-13 ENCOUNTER — Encounter: Payer: Self-pay | Admitting: Family

## 2024-01-13 ENCOUNTER — Ambulatory Visit (INDEPENDENT_AMBULATORY_CARE_PROVIDER_SITE_OTHER): Admitting: Family

## 2024-01-13 VITALS — BP 112/78 | HR 66 | Temp 97.2°F | Ht 62.0 in | Wt 141.6 lb

## 2024-01-13 DIAGNOSIS — L0501 Pilonidal cyst with abscess: Secondary | ICD-10-CM

## 2024-01-13 DIAGNOSIS — L0591 Pilonidal cyst without abscess: Secondary | ICD-10-CM | POA: Diagnosis not present

## 2024-01-13 MED ORDER — DOXYCYCLINE HYCLATE 100 MG PO CAPS: 100.0000 mg | ORAL_CAPSULE | Freq: Two times a day (BID) | ORAL | 0 refills | Status: AC

## 2024-01-13 MED ORDER — CEFDINIR 300 MG PO CAPS: 300.0000 mg | ORAL_CAPSULE | Freq: Two times a day (BID) | ORAL | 0 refills | Status: AC

## 2024-01-13 NOTE — Progress Notes (Signed)
 Patient ID: Cindy Cook, female    DOB: [birth year], 23 y.o.   MRN: [id number]  Chief Complaint  Patient presents with   Pilonidal cyst  Discussed the use of AI scribe software for clinical note transcription with the patient, who gave verbal consent to proceed.  History of Present Illness Cindy Cook is a 23 year old female with a history of pilonidal abscess who presents for follow-up for wound care.   She had a pilonidal cyst drained 7 days ago in the ED, given IV abt, sepsis ruled out (per review of ED notes) and is currently on doxycycline and cefdinir . The cyst was packed with packing strip and covered with gauze, which has been changed in this office twice. Currently, she experiences no pain, and denies needing to change the outer gauze dressing since last changed in the office on Friday. She was prescribed Oxycodone which she took prior to her last visit, but only took Ibuprofen today as she had to drive herself here. This is not her first occurrence of a pilonidal cyst; she had a similar issue about six years ago at the age of 51, which required drainage three times due to persistent infection.  She is on her last dose of antibiotics today and has been using ibuprofen for pain management. The wound drainage has decreased, and the pain is less than before.   Assessment & Plan Pilonidal cyst with abscess Status post incision and drainage 7 days ago and wound packing change twice so far, last time 3 days ago. Reports improvement with less pain and better mobility. Approx. 0.4cm wound opening, no spontaneous purulent drainage today, but able to express a small amount.  Area of induration smaller periwound. Area cleaned with betadine, repacked with 1/4 packing strip, approx. 2.5cm deep, covered with several 2x2 inch gauze, secured with Tegaderm. Remains afebrile. - Continue antibiotics as directed, sending more pills for full 10 day course. -  Increase protein and Vitamin C intake. - Advised to keep the area dry, ok to change outer gauze if moist, leaving strip gauze in wound until the follow-up visit. - Ok to take up to 600mg  Ibuprofen tid prn for pain. - Advised to return in 3 days for recheck.   Subjective:    Outpatient Medications Prior to Visit  Medication Sig Dispense Refill   oxyCODONE (ROXICODONE) 5 MG immediate release tablet Take 1 tablet (5 mg total) by mouth every 4 (four) hours as needed for severe pain (pain score 7-10). 12 tablet 0   cefdinir  (OMNICEF ) 300 MG capsule Take 1 capsule (300 mg total) by mouth 2 (two) times daily for 7 days. 14 capsule 0   doxycycline (VIBRAMYCIN) 100 MG capsule Take 1 capsule (100 mg total) by mouth 2 (two) times daily for 7 days. 14 capsule 0   No facility-administered medications prior to visit.   Past Medical History:  Diagnosis Date   Pilonidal cyst    Syncope    History reviewed. No pertinent surgical history. No Known Allergies    Objective:    Physical Exam Vitals and nursing note reviewed.  Constitutional:      Appearance: Normal appearance.  Cardiovascular:     Rate and Rhythm: Normal rate and regular rhythm.  Pulmonary:     Effort: Pulmonary effort is normal.     Breath sounds: Normal breath sounds.  Musculoskeletal:        General: Normal range of motion.  Skin:    General: Skin is  warm and dry.     Findings: Abscess (Approx. 0.4cm wound opening at buttock cleft,  able to express small amt of thin yellow drg. Small amount of induration noted periwound. Depth approx 2.5cm) present.  Neurological:     Mental Status: She is alert.  Psychiatric:        Mood and Affect: Mood normal.        Behavior: Behavior normal.    BP 112/78 (BP Location: Left Arm, Patient Position: Sitting, Cuff Size: Large)   Pulse 66   Temp (!) 97.2 F (36.2 C) (Temporal)   Ht 5' 2 (1.575 m)   Wt 141 lb 9.6 oz (64.2 kg)   LMP 01/05/2024 (Exact Date)   SpO2 99%   BMI 25.90  kg/m  Wt Readings from Last 3 Encounters:  01/13/24 141 lb 9.6 oz (64.2 kg)  01/10/24 141 lb 6 oz (64.1 kg)  01/08/24 140 lb (63.5 kg)      Lucius Krabbe, NP

## 2024-01-16 ENCOUNTER — Encounter: Payer: Self-pay | Admitting: Family

## 2024-01-16 ENCOUNTER — Ambulatory Visit: Admitting: Family

## 2024-01-16 VITALS — BP 102/66 | HR 76 | Temp 97.3°F | Ht 62.0 in | Wt 139.0 lb

## 2024-01-16 DIAGNOSIS — L0501 Pilonidal cyst with abscess: Secondary | ICD-10-CM

## 2024-01-16 NOTE — Progress Notes (Signed)
 Patient ID: Cindy Cook, female    DOB: Feb 12, 2001, 23 y.o.   MRN: [id number]  Chief Complaint  Patient presents with   Pilonidal abscess    Follow up   Discussed the use of AI scribe software for clinical note transcription with the patient, who gave verbal consent to proceed.  History of Present Illness Cindy Cook is a 23 year old female who presents for follow-up of a previously drained abscess.  There was no drainage from the abscess site since the last visit that soaked through the bandage.  She experiences discomfort when sitting, which she associates with recent hiking activities. She is completing her course of antibiotics today. The abscess was previously opened and drained in the ED. This is her 3rd visit for wound care.  Assessment & Plan Pilonidal abscess with sinus tracts Abscess improved with reduced drainage and no tenderness. Potential sinus tracts noted, risk of recurrence if not monitored. Mild drainage noted on dressing removed, packing strip removed. Able to express scant amount of light yellow, thin drainage. Wound cleaned with betadine, approx 1.5cm strip of packing strip inserted, covered with 2x2 gauze and secured with Tegaderm. - Complete current course of antibiotics today. - Remove dressing in a few days, preferably prior to showering, and clean with soap and water. - Thoroughly dry the area after washing. - Apply a Band-Aid if damp after drying. - Shave hair around the area to prevent recurrence. - Monitor for signs of recurrence. - Refer for surgical intervention if recurrence occurs.   Subjective:    Outpatient Medications Prior to Visit  Medication Sig Dispense Refill   cefdinir  (OMNICEF ) 300 MG capsule Take 1 capsule (300 mg total) by mouth 2 (two) times daily. 6 capsule 0   doxycycline (VIBRAMYCIN) 100 MG capsule Take 1 capsule (100 mg total) by mouth 2 (two) times daily for 3 days. 6 capsule 0    oxyCODONE (ROXICODONE) 5 MG immediate release tablet Take 1 tablet (5 mg total) by mouth every 4 (four) hours as needed for severe pain (pain score 7-10). 12 tablet 0   No facility-administered medications prior to visit.   Past Medical History:  Diagnosis Date   Pilonidal cyst    Syncope    History reviewed. No pertinent surgical history. No Known Allergies    Objective:    Physical Exam Vitals and nursing note reviewed.  Constitutional:      Appearance: Normal appearance.  Cardiovascular:     Rate and Rhythm: Normal rate and regular rhythm.  Pulmonary:     Effort: Pulmonary effort is normal.     Breath sounds: Normal breath sounds.  Musculoskeletal:        General: Normal range of motion.  Skin:    General: Skin is warm and dry.     Findings: Abscess (Approx. 0.4cm wound opening at buttock cleft,  able to express scant of thin yellowish drg. Small amount of induration noted periwound. Depth approx 1.5cm.) present.  Neurological:     Mental Status: She is alert.  Psychiatric:        Mood and Affect: Mood normal.        Behavior: Behavior normal.    BP 102/66 (BP Location: Left Arm, Patient Position: Sitting, Cuff Size: Normal)   Pulse 76   Temp (!) 97.3 F (36.3 C) (Temporal)   Ht 5' 2 (1.575 m)   Wt 139 lb (63 kg)   LMP 01/05/2024 (Exact Date)   SpO2 98%  BMI 25.42 kg/m  Wt Readings from Last 3 Encounters:  01/16/24 139 lb (63 kg)  01/13/24 141 lb 9.6 oz (64.2 kg)  01/10/24 141 lb 6 oz (64.1 kg)      Lucius Krabbe, NP
